# Patient Record
Sex: Female | Born: 1990 | Race: White | Hispanic: No | Marital: Single
Health system: Southern US, Community
[De-identification: ages and names within clinical notes are randomized; demographics above are authoritative.]

---
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
## 2008-02-04 ENCOUNTER — Observation Stay: Payer: Self-pay | Admitting: Obstetrics and Gynecology

## 2008-02-08 ENCOUNTER — Observation Stay: Payer: Self-pay | Admitting: Obstetrics and Gynecology

## 2008-03-30 ENCOUNTER — Inpatient Hospital Stay: Payer: Self-pay

## 2008-11-04 ENCOUNTER — Emergency Department: Payer: Self-pay | Admitting: Emergency Medicine

## 2009-05-27 ENCOUNTER — Observation Stay: Payer: Self-pay

## 2009-06-03 ENCOUNTER — Inpatient Hospital Stay: Payer: Self-pay

## 2011-05-01 ENCOUNTER — Emergency Department: Payer: Self-pay | Admitting: Emergency Medicine

## 2012-04-02 ENCOUNTER — Ambulatory Visit: Payer: Self-pay

## 2012-05-14 ENCOUNTER — Ambulatory Visit: Payer: Self-pay | Admitting: Gynecologic Oncology

## 2012-09-02 ENCOUNTER — Ambulatory Visit: Payer: Self-pay | Admitting: Gynecologic Oncology

## 2012-09-29 ENCOUNTER — Emergency Department: Payer: Self-pay | Admitting: Emergency Medicine

## 2012-09-29 LAB — COMPREHENSIVE METABOLIC PANEL
Albumin: 4.2 g/dL (ref 3.4–5.0)
Alkaline Phosphatase: 59 U/L (ref 50–136)
BUN: 8 mg/dL (ref 7–18)
Chloride: 105 mmol/L (ref 98–107)
EGFR (African American): >60
EGFR (Non-African Amer.): >60
Osmolality: 267 (ref 275–301)
Potassium: 3.7 mmol/L (ref 3.5–5.1)
Sodium: 135 mmol/L — ABNORMAL LOW (ref 136–145)
Total Protein: 8.6 g/dL — ABNORMAL HIGH (ref 6.4–8.2)

## 2012-09-29 LAB — CBC
HGB: 14.6 g/dL (ref 12.0–16.0)
MCHC: 35.4 g/dL (ref 32.0–36.0)
MCV: 92 fL (ref 80–100)
Platelet: 239 10*3/uL (ref 150–440)
RBC: 4.47 10*6/uL (ref 3.80–5.20)
RDW: 13 % (ref 11.5–14.5)
WBC: 9.6 10*3/uL (ref 3.6–11.0)

## 2012-09-29 LAB — URINALYSIS, COMPLETE
Bilirubin,UR: NEGATIVE
Blood: NEGATIVE
Glucose,UR: NEGATIVE mg/dL (ref 0–75)
Leukocyte Esterase: NEGATIVE
Ph: 9 (ref 4.5–8.0)
RBC,UR: <1 /HPF (ref 0–5)
Specific Gravity: 1.017 (ref 1.003–1.030)
WBC UR: 1 /HPF (ref 0–5)

## 2012-09-29 LAB — HCG, QUANTITATIVE, PREGNANCY: Beta Hcg, Quant.: 88040 m[IU]/mL — ABNORMAL HIGH

## 2012-09-29 LAB — WET PREP, GENITAL

## 2013-08-01 ENCOUNTER — Emergency Department: Payer: Self-pay | Admitting: Emergency Medicine

## 2013-08-01 LAB — URINALYSIS, COMPLETE
BILIRUBIN, UR: NEGATIVE
Blood: NEGATIVE
Glucose,UR: NEGATIVE mg/dL (ref 0–75)
LEUKOCYTE ESTERASE: NEGATIVE
Nitrite: NEGATIVE
PROTEIN: NEGATIVE
Ph: 5 (ref 4.5–8.0)
RBC,UR: 1 /HPF (ref 0–5)
Specific Gravity: 1.024 (ref 1.003–1.030)
WBC UR: 2 /HPF (ref 0–5)

## 2013-08-01 LAB — COMPREHENSIVE METABOLIC PANEL
ALBUMIN: 4.1 g/dL (ref 3.4–5.0)
ALK PHOS: 43 U/L — AB
ANION GAP: 7 (ref 7–16)
BUN: 10 mg/dL (ref 7–18)
Bilirubin,Total: 0.4 mg/dL (ref 0.2–1.0)
CALCIUM: 9.3 mg/dL (ref 8.5–10.1)
Chloride: 105 mmol/L (ref 98–107)
Co2: 21 mmol/L (ref 21–32)
Creatinine: 0.74 mg/dL (ref 0.60–1.30)
EGFR (African American): >60
EGFR (Non-African Amer.): >60
GLUCOSE: 87 mg/dL (ref 65–99)
OSMOLALITY: 265 (ref 275–301)
POTASSIUM: 3.8 mmol/L (ref 3.5–5.1)
SGOT(AST): 21 U/L (ref 15–37)
SGPT (ALT): 15 U/L (ref 12–78)
Sodium: 133 mmol/L — ABNORMAL LOW (ref 136–145)
Total Protein: 7.9 g/dL (ref 6.4–8.2)

## 2013-08-01 LAB — CBC WITH DIFFERENTIAL/PLATELET
BASOS ABS: 0.1 10*3/uL (ref 0.0–0.1)
BASOS PCT: 0.5 %
Eosinophil #: 0.1 10*3/uL (ref 0.0–0.7)
Eosinophil %: 0.7 %
HCT: 39 % (ref 35.0–47.0)
HGB: 13.8 g/dL (ref 12.0–16.0)
LYMPHS ABS: 2.7 10*3/uL (ref 1.0–3.6)
Lymphocyte %: 23.4 %
MCH: 32.4 pg (ref 26.0–34.0)
MCHC: 35.4 g/dL (ref 32.0–36.0)
MCV: 92 fL (ref 80–100)
MONO ABS: 0.8 x10 3/mm (ref 0.2–0.9)
Monocyte %: 7.1 %
NEUTROS ABS: 8 10*3/uL — AB (ref 1.4–6.5)
NEUTROS PCT: 68.3 %
PLATELETS: 221 10*3/uL (ref 150–440)
RBC: 4.25 10*6/uL (ref 3.80–5.20)
RDW: 12.9 % (ref 11.5–14.5)
WBC: 11.7 10*3/uL — ABNORMAL HIGH (ref 3.6–11.0)

## 2013-08-01 LAB — HCG, QUANTITATIVE, PREGNANCY: Beta Hcg, Quant.: 63636 m[IU]/mL — ABNORMAL HIGH

## 2013-08-01 LAB — GC/CHLAMYDIA PROBE AMP

## 2013-08-01 LAB — WET PREP, GENITAL

## 2013-09-18 ENCOUNTER — Encounter: Payer: Self-pay | Admitting: Obstetrics & Gynecology

## 2013-10-17 ENCOUNTER — Emergency Department: Payer: Self-pay | Admitting: Emergency Medicine

## 2013-10-17 LAB — URINALYSIS, COMPLETE
Bilirubin,UR: NEGATIVE
Blood: NEGATIVE
Glucose,UR: NEGATIVE mg/dL (ref 0–75)
Ketone: NEGATIVE
NITRITE: NEGATIVE
PH: 6 (ref 4.5–8.0)
Protein: NEGATIVE
RBC,UR: 7 /HPF (ref 0–5)
Specific Gravity: 1.011 (ref 1.003–1.030)

## 2013-10-17 LAB — CBC
HCT: 32.7 % — AB (ref 35.0–47.0)
HGB: 11.5 g/dL — ABNORMAL LOW (ref 12.0–16.0)
MCH: 33.2 pg (ref 26.0–34.0)
MCHC: 35.1 g/dL (ref 32.0–36.0)
MCV: 95 fL (ref 80–100)
PLATELETS: 213 10*3/uL (ref 150–440)
RBC: 3.45 10*6/uL — ABNORMAL LOW (ref 3.80–5.20)
RDW: 13.5 % (ref 11.5–14.5)
WBC: 12.4 10*3/uL — AB (ref 3.6–11.0)

## 2013-10-17 LAB — HCG, QUANTITATIVE, PREGNANCY: Beta Hcg, Quant.: 14144 m[IU]/mL — ABNORMAL HIGH

## 2013-10-17 LAB — GC/CHLAMYDIA PROBE AMP

## 2013-10-17 LAB — WET PREP, GENITAL

## 2013-10-19 LAB — URINE CULTURE

## 2013-11-16 IMAGING — US US OB < 14 WEEKS
1 series · 13 of 28 positions shown · non-contrast
Comparison: none

REASON FOR EXAM: pregnant, abdominal pain
COMMENTS:

PROCEDURE:     US  - US OB LESS THAN 14 WEEKS  - September 29, 2012  [DATE]
RESULT:     Comparison: 11/04/2008
TECHNIQUE: Multiple grayscale and color Doppler images were obtained of the
pelvis via transabdominal ultrasound. Endovaginal ultrasound was not
performed.

[Series 1: us ob < 14 weeks · 0.23mm/px · 13 of 60 slices shown]
[im 3/60]
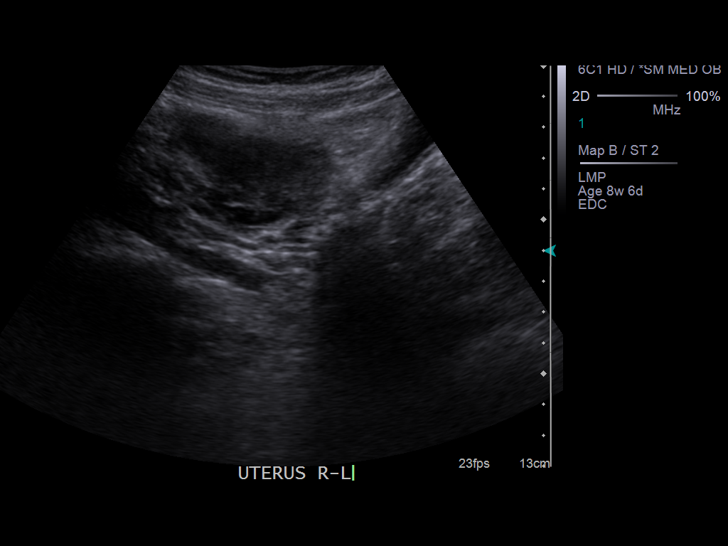
[im 7/60]
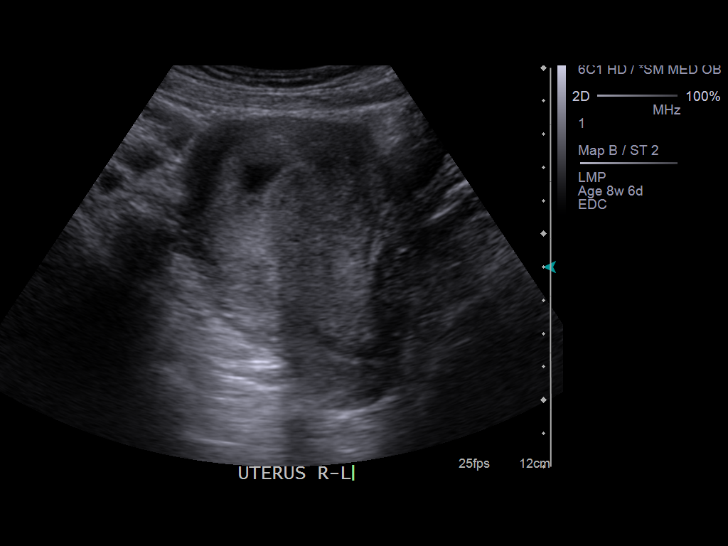
[im 11/60]
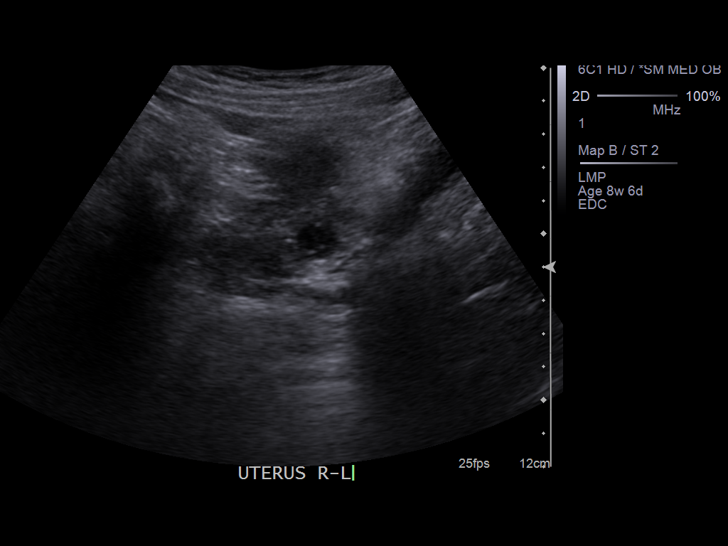
[im 16/60]
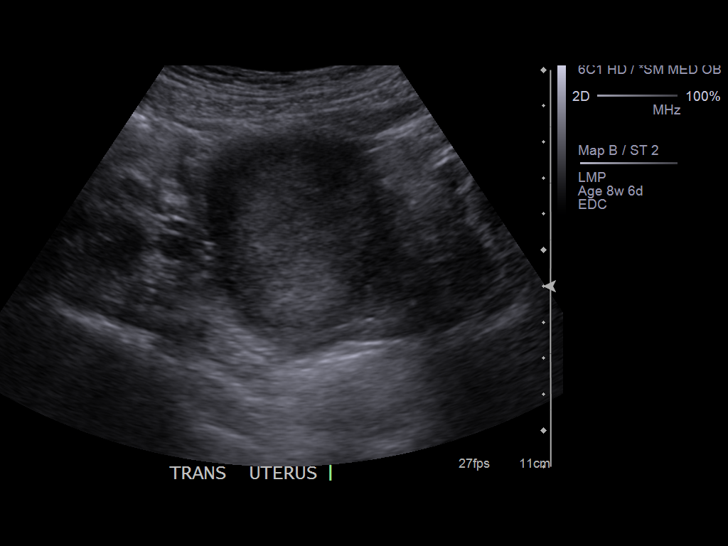
[im 20/60]
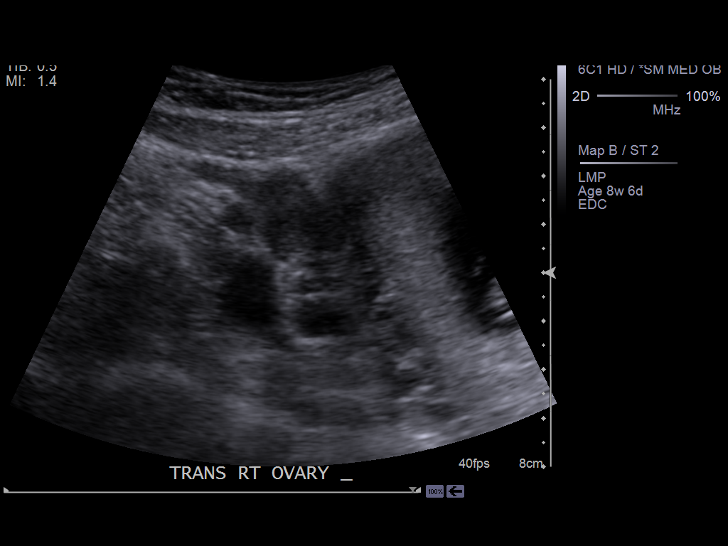
[im 25/60]
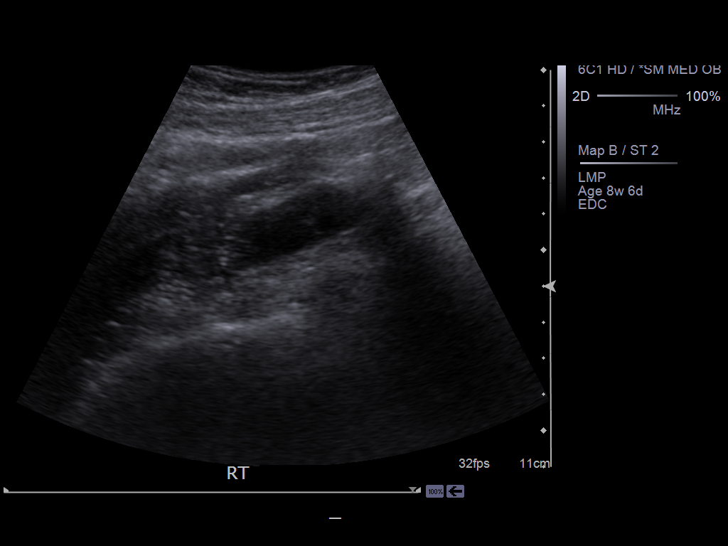
[im 31/60]
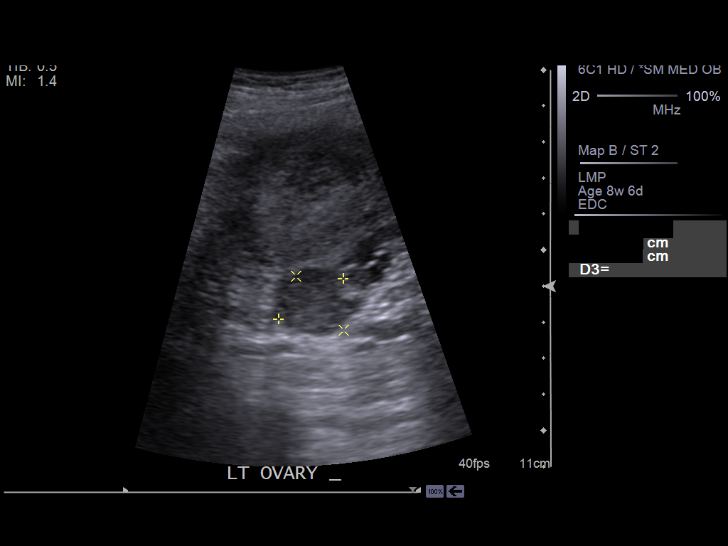
[im 35/60]
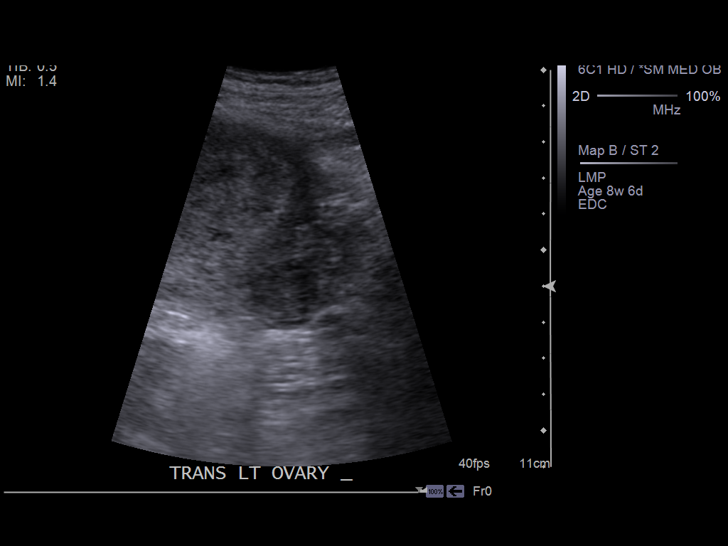
[im 40/60]
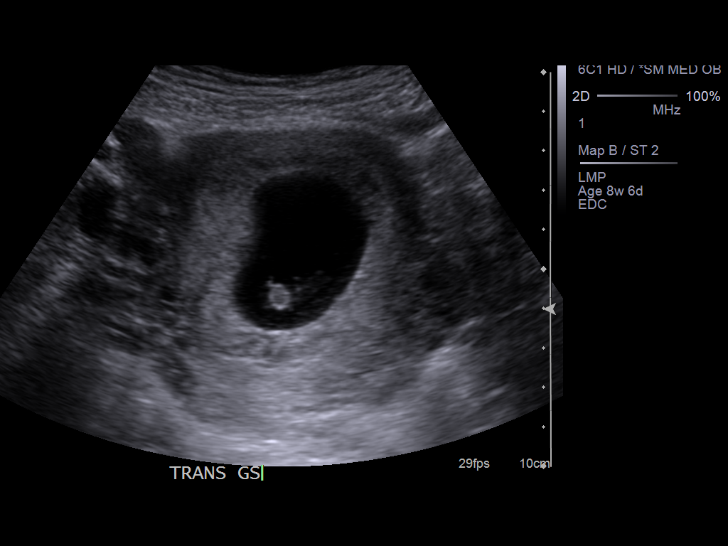
[im 44/60]
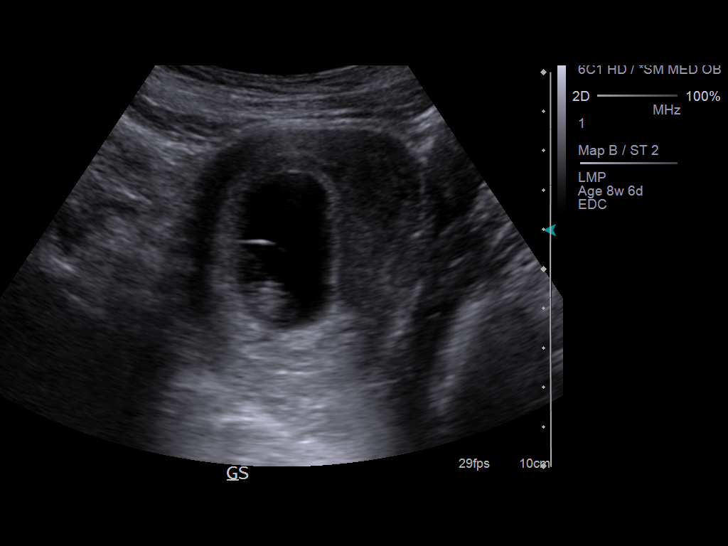
[im 49/60]
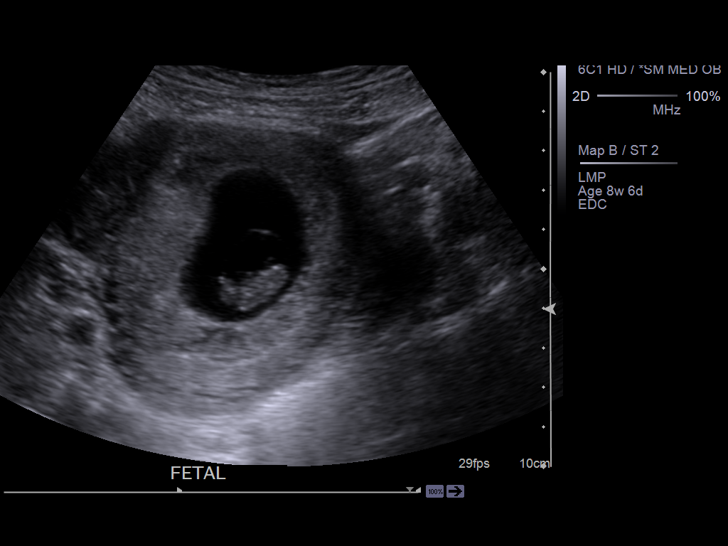
[im 53/60]
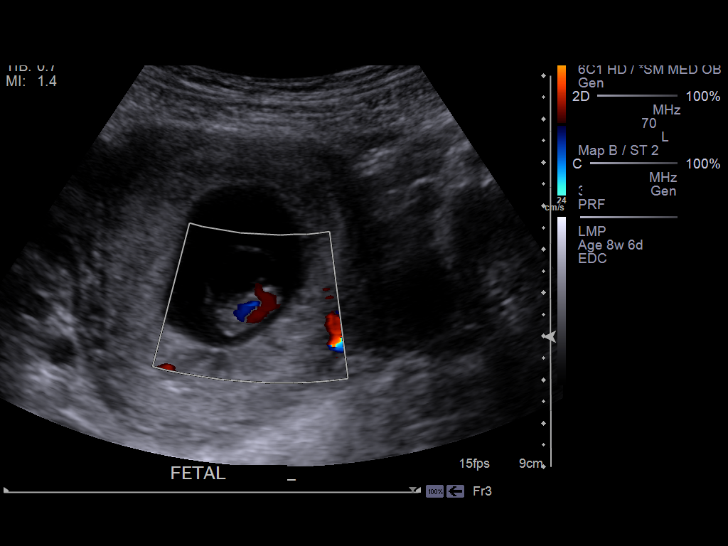
[im 57/60]
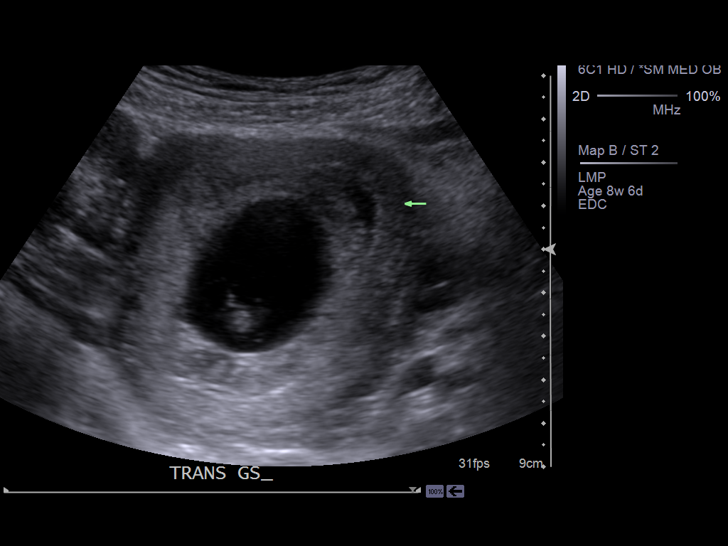

[13 of 28 positions shown; findings below may reference images not displayed]

FINDINGS: There is a gestational sac within the endometrial canal. There is a fetal
pole with a crown-rump length of 1.57 cm, which correlates with estimated
gestational age of 8 weeks 0 days. A yolk sac is present. The fetal heart
rate was 168 beats per minute. There is a small crescentic hypoechoic
collection along the periphery of the gestational sac which likely
represents a small subchorionic hematoma.

The right ovary measures 1.7 x 2.5 x 1.4 cm. The left ovary measures 2.7 x
2.0 x 2.1 cm. Color Doppler flow is associated with the bilateral ovaries.
Spectral Doppler imaging was not performed. No adnexal mass identified.
IMPRESSION: Single live intrauterine pregnancy with estimated gestational age of 8 weeks
0 days. There are findings which likely represent a small adjacent
subchorionic hematoma. Clinical followup and followup ultrasound are
suggested.

[REDACTED]

## 2013-11-20 ENCOUNTER — Encounter: Payer: Self-pay | Admitting: Obstetrics & Gynecology

## 2013-12-23 ENCOUNTER — Observation Stay: Payer: Self-pay | Admitting: Obstetrics & Gynecology

## 2013-12-25 ENCOUNTER — Encounter: Payer: Self-pay | Admitting: Obstetrics and Gynecology

## 2014-01-29 ENCOUNTER — Encounter: Payer: Self-pay | Admitting: Maternal & Fetal Medicine

## 2014-03-18 ENCOUNTER — Inpatient Hospital Stay: Payer: Self-pay

## 2014-03-18 LAB — GC/CHLAMYDIA PROBE AMP

## 2014-03-18 LAB — CBC WITH DIFFERENTIAL/PLATELET
BASOS ABS: 0.1 10*3/uL (ref 0.0–0.1)
Basophil %: 0.3 %
EOS ABS: 0.1 10*3/uL (ref 0.0–0.7)
Eosinophil %: 0.5 %
HCT: 31.7 % — AB (ref 35.0–47.0)
HGB: 10.6 g/dL — ABNORMAL LOW (ref 12.0–16.0)
LYMPHS ABS: 2.5 10*3/uL (ref 1.0–3.6)
Lymphocyte %: 13.1 %
MCH: 32.4 pg (ref 26.0–34.0)
MCHC: 33.4 g/dL (ref 32.0–36.0)
MCV: 97 fL (ref 80–100)
MONO ABS: 1.2 x10 3/mm — AB (ref 0.2–0.9)
Monocyte %: 6 %
NEUTROS ABS: 15.6 10*3/uL — AB (ref 1.4–6.5)
Neutrophil %: 80.1 %
Platelet: 233 10*3/uL (ref 150–440)
RBC: 3.27 10*6/uL — AB (ref 3.80–5.20)
RDW: 13.4 % (ref 11.5–14.5)
WBC: 19.4 10*3/uL — ABNORMAL HIGH (ref 3.6–11.0)

## 2014-03-18 LAB — DRUG SCREEN, URINE

## 2014-03-19 LAB — HEMATOCRIT: HCT: 29 % — ABNORMAL LOW (ref 35.0–47.0)

## 2014-09-18 IMAGING — US US OB < 14 WEEKS - US OB TV
1 series · 14 of 28 positions shown · non-contrast
Comparison: 09/29/2012

CLINICAL DATA: Abdominal pain and pregnancy.

EXAM:
OBSTETRIC <14 WK US AND TRANSVAGINAL OB US
TECHNIQUE: Both transabdominal and transvaginal ultrasound examinations were
performed for complete evaluation of the gestation as well as the
maternal uterus, adnexal regions, and pelvic cul-de-sac.
Transvaginal technique was performed to assess early pregnancy.

[Series 1: us ob < 14 weeks - us ob tv · 0.17mm/px · 44 acquisitions, 14 frames shown]
[im 2/44]
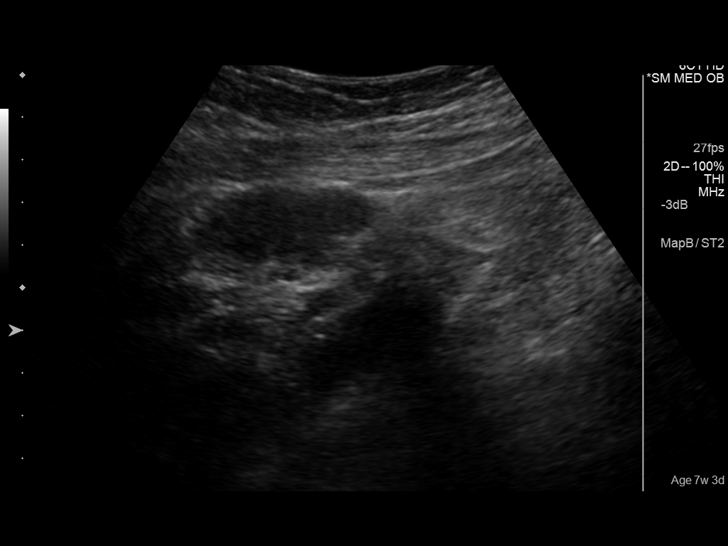
[im 5/44]
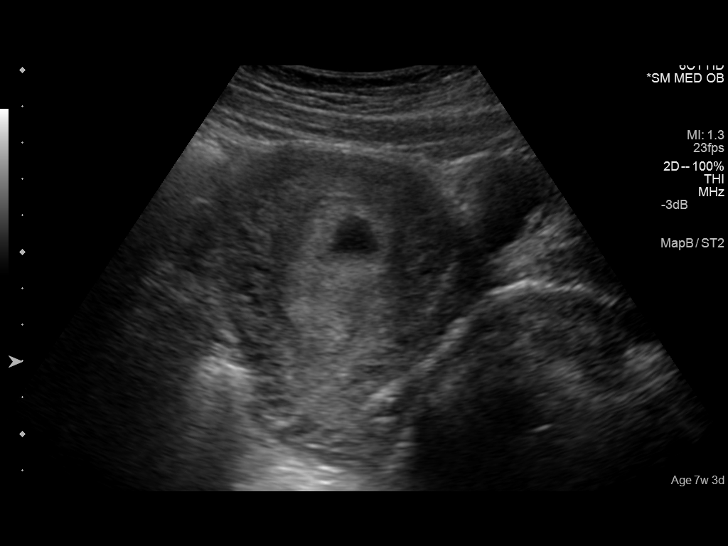
[im 8/44]
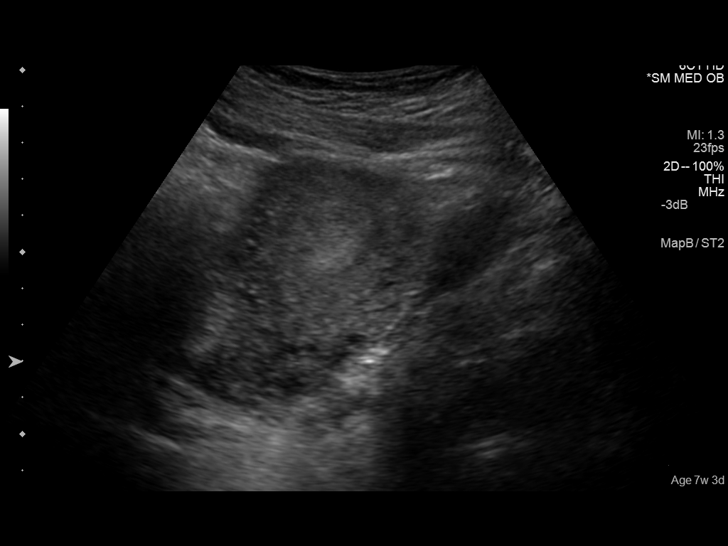
[im 12/44]
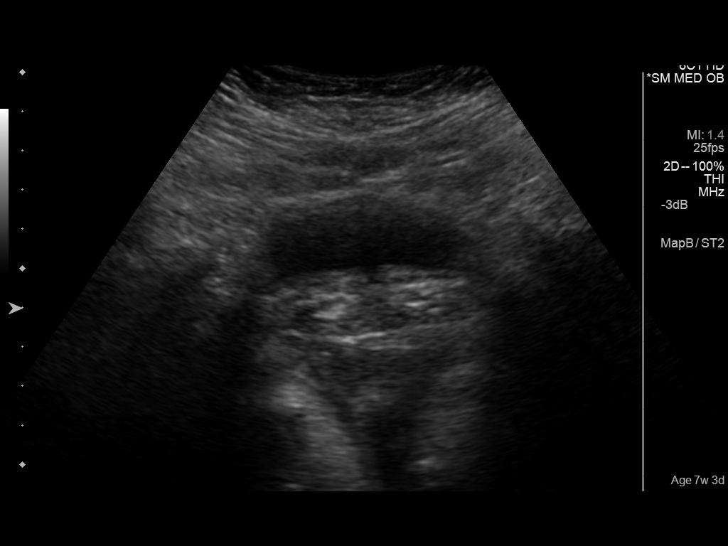
[im 15/44]
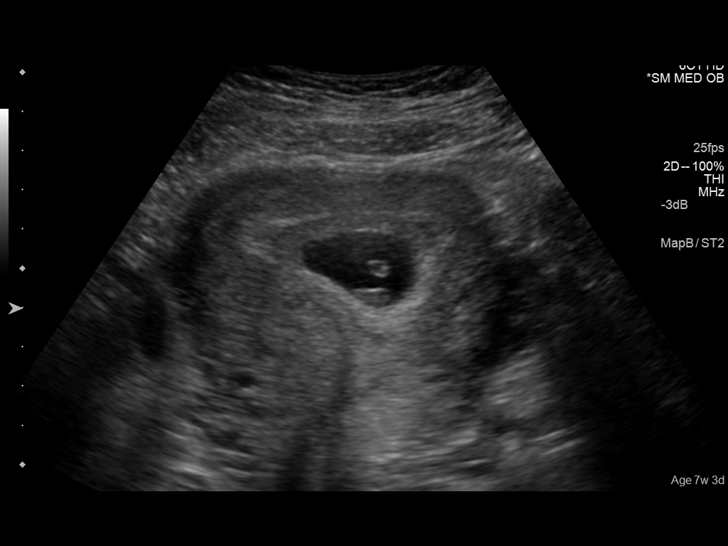
[im 18/44]
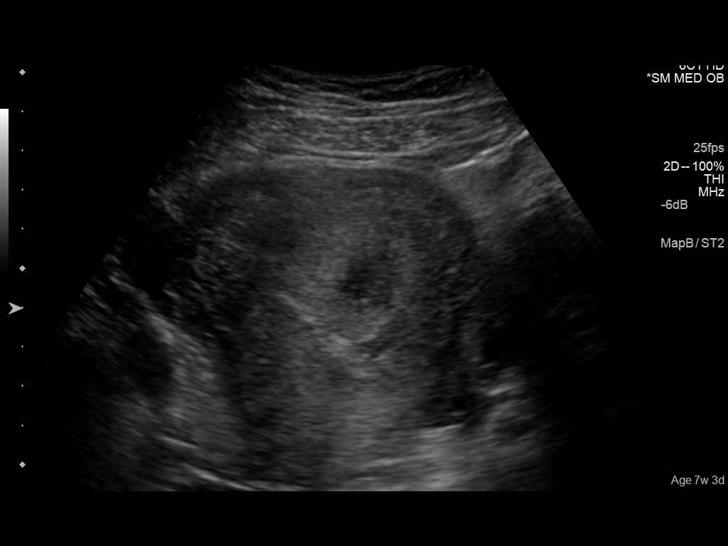
[im 21/44]
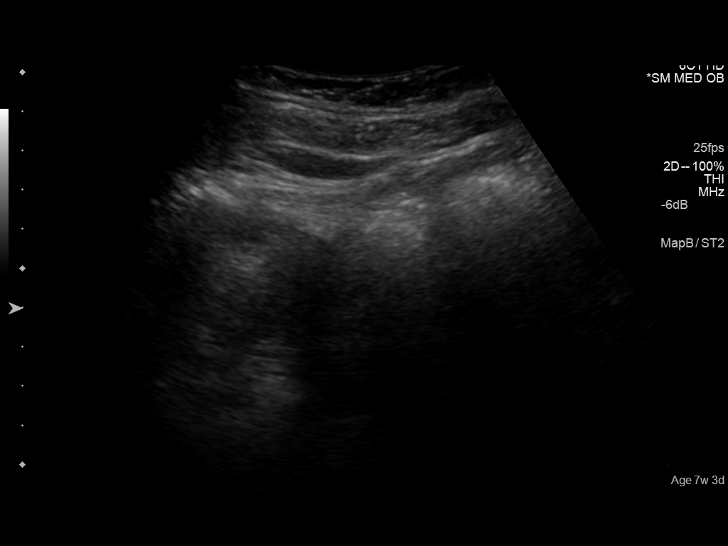
[im 24/44]
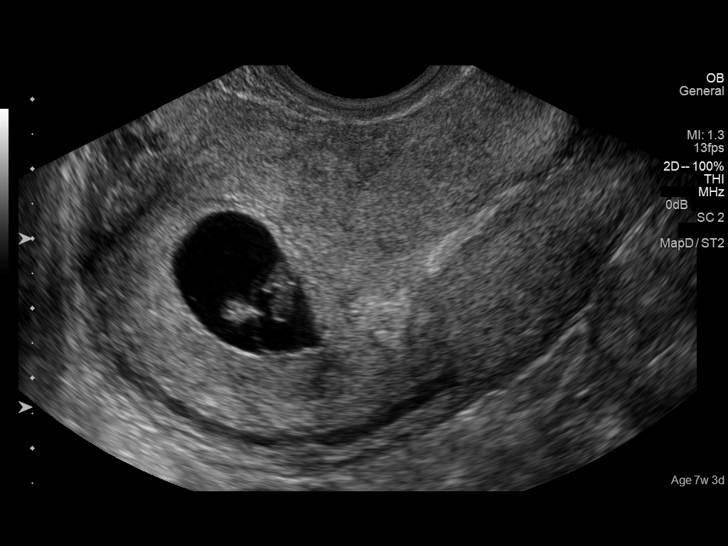
[im 28/44]
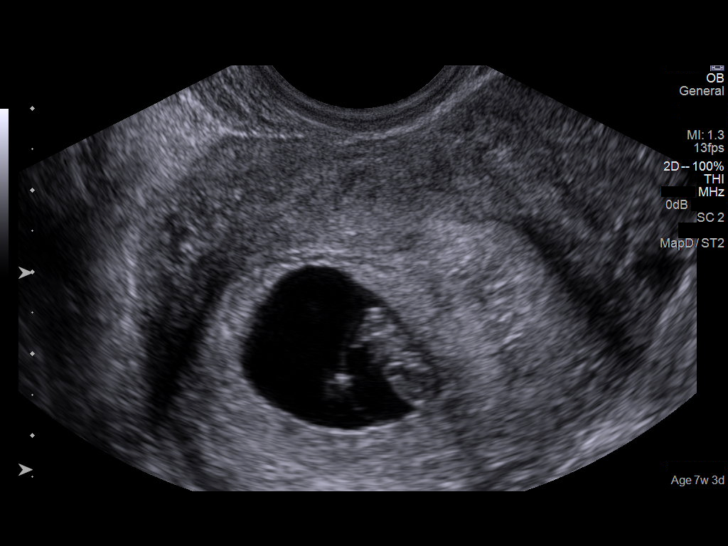
[im 31/44]
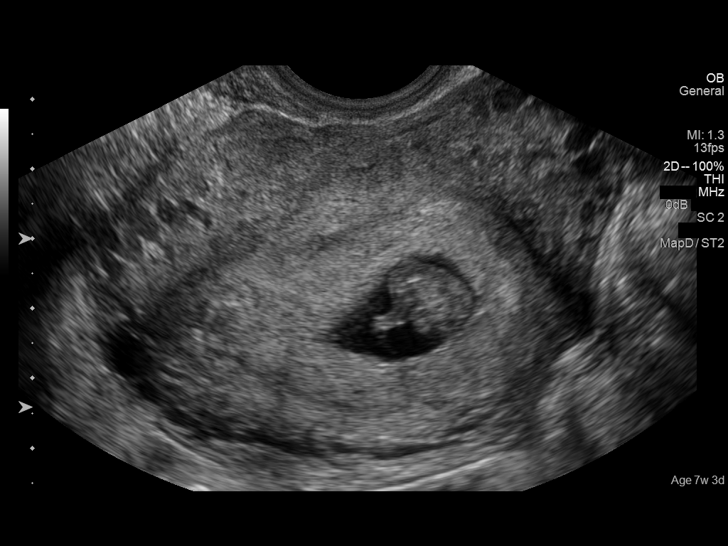
[im 34/44]
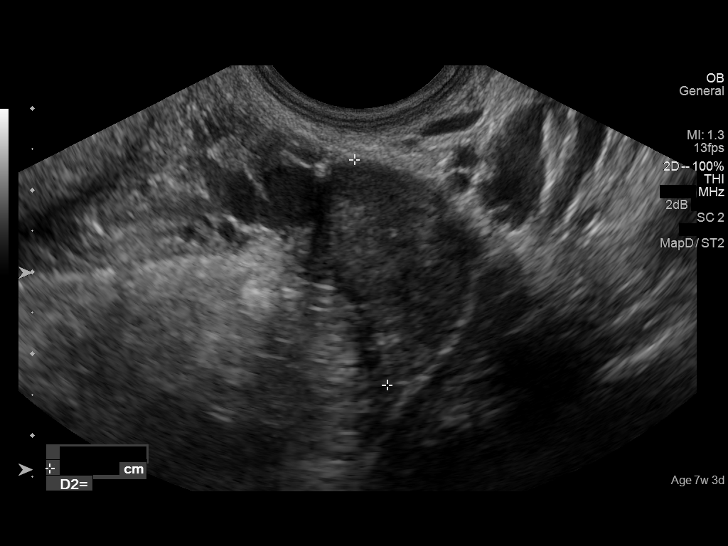
[im 37/44]
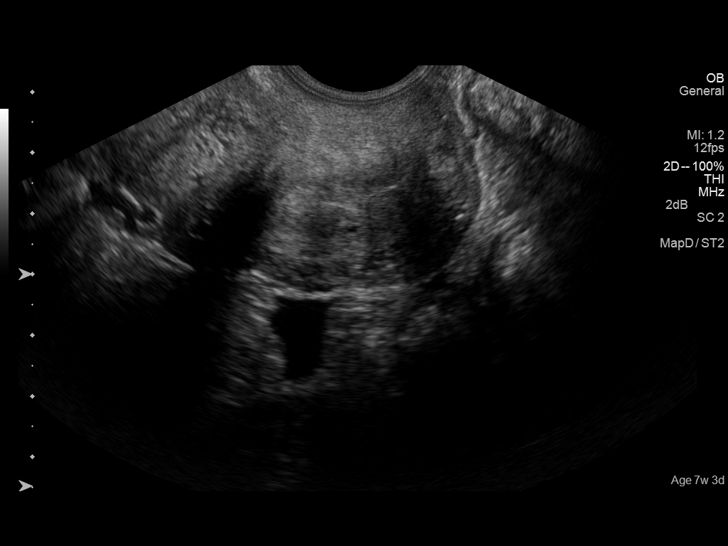
[im 40/44]
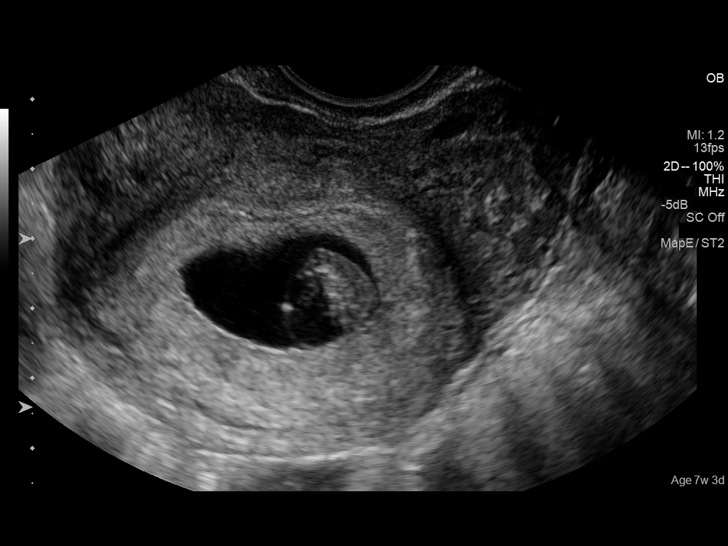
[im 44/44]
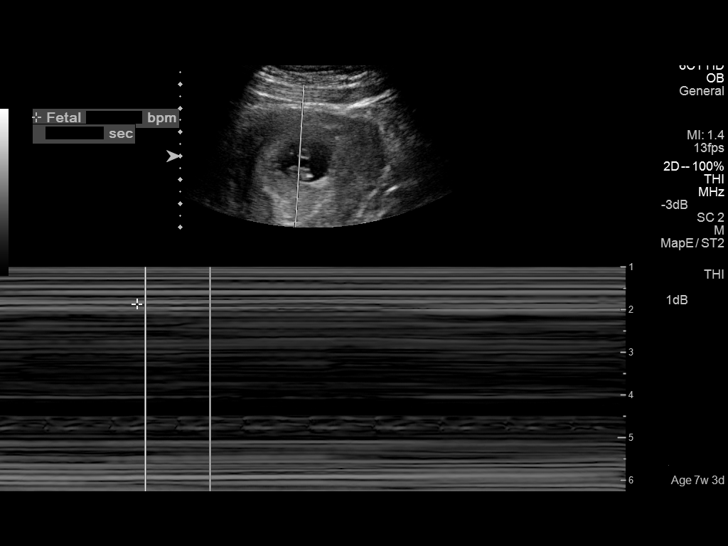

[14 of 28 positions shown; findings below may reference images not displayed]

FINDINGS: Intrauterine gestational sac: Visualized/normal in shape. No
subchorionic hemorrhage.

Yolk sac:  Present

Embryo:  Present

Cardiac Activity: Present

Heart Rate:  158 bpm

CRL:   12.9  mm   7 w 4 d                  US EDC: 03/16/2014

Maternal uterus/adnexae: The ovaries are symmetric and normal in
appearance/volume. Other than the gestation, the uterus appears
unremarkable. There is a small volume of free pelvic fluid which is
simple in appearance.
IMPRESSION: Single, living intrauterine gestation, estimated age 7 weeks 4 days.

## 2014-10-02 ENCOUNTER — Emergency Department: Payer: Self-pay | Admitting: Emergency Medicine

## 2014-11-24 NOTE — H&P (Signed)
L&D Evaluation:  History Expanded:  HPI 24 yo G4 P 2012 with EDD of 03/24/14 per LMP and 8 wk US, presents at 2962w1d with c/o contractions. No LOF or VB. +FM. [facility] notable for marijuana use with negative UDS 02/04/14 & 11/10/13. + Chlamydia with negative TOC. [facility], [facility] at most recent scan. Had TDAP on 02/04/14.  Prior SVD 7# 3oz female in 2009, 6# 3oz female in 2010   Blood Type (Maternal) A positive   Group B Strep Results Maternal (Result >5wks must be treated as unknown) negative   Maternal HIV Negative   Maternal Syphilis Ab Nonreactive   Maternal Varicella Immune   Rubella Results (Maternal) immune   Maternal T-Dap Immune   Patient's Medical History No Chronic Illness   Patient's Surgical History none   Medications Pre Natal Vitamins   Allergies PCN, zofran   Social History tobacco  drugs  5-10 cig/day, prior marijuana use   Exam:  Vital Signs stable   General no apparent distress   Mental Status clear   Chest clear   Heart no murmur/gallop/rubs   Abdomen gravid, tender with contractions   Estimated Fetal Weight Average for gestational age   Pelvic Cervix progressed from 3-4 cm, now 6 cm per RN.   Mebranes Intact   FHT normal rate with no decels   Ucx regular   Impression:  Impression active labor   Plan:  Comments Getting comfortable after epidural. AROM once comfortable. Anticipate vaginal delivery.   Electronic Signatures: Vella KohlerBrothers, Gigi Onstad K (CNM)  (Signed 02-Sep-15 14:48)  Authored: L&D Evaluation   Last Updated: 02-Sep-15 14:48 by Vella KohlerBrothers, Sher Shampine K (CNM)
